# Patient Record
Sex: Male | Born: 2000 | Race: Black or African American | Hispanic: No | Marital: Single | Smoking: Never smoker
Health system: Southern US, Community
[De-identification: ages and names within clinical notes are randomized; demographics above are authoritative.]

## PROBLEM LIST (undated history)

## (undated) ENCOUNTER — Emergency Department (HOSPITAL_COMMUNITY): Payer: Medicaid Other

## (undated) DIAGNOSIS — J302 Other seasonal allergic rhinitis: Secondary | ICD-10-CM

## (undated) HISTORY — DX: Other seasonal allergic rhinitis: J30.2

---
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
## 2021-09-21 NOTE — Progress Notes (Signed)
    SUBJECTIVE:   CHIEF COMPLAINT / HPI:  Chief Complaint  Patient presents with   Sinus Problem    Patient reports that he has had ongoing symptoms of congestion, rhinorrhea, occasional cough for over 1 year.  He often feels drainage in his throat.  He was taking a nasal spray at some point which did help but he has not taken this for a long time.  He notices symptoms mostly in the fall and spring.  He reports he has not been sexually active recently but does want STI screening.  He does not want blood work today.   declines HIV and RPR.  PERTINENT  PMH / PSH:   Patient Care Team: Littie Deeds, MD as PCP - General (Family Medicine)   OBJECTIVE:   BP 104/77   Pulse 74   Ht 5\' 8"  (1.727 m)   Wt 128 lb 2 oz (58.1 kg)   SpO2 97%   BMI 19.48 kg/m   Physical Exam Constitutional:      General: He is not in acute distress. HENT:     Head: Normocephalic and atraumatic.     Comments: No frontal or maxillary sinus tenderness    Nose: Nose normal.     Mouth/Throat:     Mouth: Mucous membranes are moist.     Pharynx: Oropharynx is clear.  Cardiovascular:     Rate and Rhythm: Normal rate and regular rhythm.  Pulmonary:     Effort: Pulmonary effort is normal. No respiratory distress.     Breath sounds: Normal breath sounds.  Neurological:     Mental Status: He is alert.         09/24/2021    2:50 PM  Depression screen PHQ 2/9  Decreased Interest 1  Down, Depressed, Hopeless 1  PHQ - 2 Score 2  Altered sleeping 0  Tired, decreased energy 0  Change in appetite 0  Feeling bad or failure about yourself  1  Trouble concentrating 0  Moving slowly or fidgety/restless 0  Suicidal thoughts 0  PHQ-9 Score 3  Difficult doing work/chores Not difficult at all     {Show previous vital signs (optional):23777}    ASSESSMENT/PLAN:   Allergic rhinitis Seasonal allergic rhinitis in the fall and spring. - cetirizine - Flonase    Return in about 6 months (around 03/26/2022)  for physical.   Littie Deeds, MD [facility]

## 2021-09-24 ENCOUNTER — Encounter: Payer: Self-pay | Admitting: Family Medicine

## 2021-09-24 ENCOUNTER — Telehealth: Payer: Self-pay | Admitting: Family Medicine

## 2021-09-24 ENCOUNTER — Ambulatory Visit (INDEPENDENT_AMBULATORY_CARE_PROVIDER_SITE_OTHER): Payer: Medicaid Other | Admitting: Family Medicine

## 2021-09-24 ENCOUNTER — Other Ambulatory Visit (HOSPITAL_COMMUNITY)
Admission: RE | Admit: 2021-09-24 | Discharge: 2021-09-24 | Disposition: A | Discharge status: Home / Self Care | Payer: Medicaid Other | Source: Ambulatory Visit | Attending: Family Medicine | Admitting: Family Medicine

## 2021-09-24 VITALS — BP 104/77 | HR 74 | Ht 68.0 in | Wt 128.1 lb

## 2021-09-24 DIAGNOSIS — Z113 Encounter for screening for infections with a predominantly sexual mode of transmission: Secondary | ICD-10-CM

## 2021-09-24 DIAGNOSIS — J309 Allergic rhinitis, unspecified: Secondary | ICD-10-CM | POA: Insufficient documentation

## 2021-09-24 DIAGNOSIS — J302 Other seasonal allergic rhinitis: Secondary | ICD-10-CM

## 2021-09-24 MED ORDER — FLUTICASONE PROPIONATE 50 MCG/ACT NA SUSP: 1.0000 | Freq: Every day | NASAL | 12 refills | Status: DC

## 2021-09-24 MED ORDER — CETIRIZINE HCL 10 MG PO TABS: 10.0000 mg | ORAL_TABLET | Freq: Every day | ORAL | 11 refills | Status: DC

## 2021-09-24 NOTE — Telephone Encounter (Signed)
Medications sent to [facility]

## 2021-09-24 NOTE — Assessment & Plan Note (Signed)
Seasonal allergic rhinitis in the fall and spring. - cetirizine - Flonase

## 2021-09-25 ENCOUNTER — Encounter: Payer: Self-pay | Admitting: Family Medicine

## 2021-09-25 LAB — URINE CYTOLOGY ANCILLARY ONLY
Chlamydia: NEGATIVE
Comment: NEGATIVE
Comment: NORMAL
Neisseria Gonorrhea: NEGATIVE

## 2022-04-27 ENCOUNTER — Other Ambulatory Visit: Payer: Self-pay

## 2022-04-27 ENCOUNTER — Emergency Department (HOSPITAL_BASED_OUTPATIENT_CLINIC_OR_DEPARTMENT_OTHER)
Admission: EM | Admit: 2022-04-27 | Discharge: 2022-04-27 | Disposition: A | Discharge status: Home / Self Care | Payer: Medicaid Other | Attending: Emergency Medicine | Admitting: Emergency Medicine

## 2022-04-27 DIAGNOSIS — W228XXA Striking against or struck by other objects, initial encounter: Secondary | ICD-10-CM | POA: Insufficient documentation

## 2022-04-27 DIAGNOSIS — Z23 Encounter for immunization: Secondary | ICD-10-CM | POA: Insufficient documentation

## 2022-04-27 DIAGNOSIS — S01111A Laceration without foreign body of right eyelid and periocular area, initial encounter: Secondary | ICD-10-CM | POA: Insufficient documentation

## 2022-04-27 MED ORDER — TETANUS-DIPHTH-ACELL PERTUSSIS 5-2.5-18.5 LF-MCG/0.5 IM SUSY
0.5000 mL | PREFILLED_SYRINGE | Freq: Once | INTRAMUSCULAR | Status: AC
  Administered 2022-04-27: 0.5 mL via INTRAMUSCULAR
  Filled 2022-04-27: qty 0.5

## 2022-04-27 MED ORDER — LIDOCAINE-EPINEPHRINE-TETRACAINE (LET) TOPICAL GEL
3.0000 mL | Freq: Once | TOPICAL | Status: AC
  Administered 2022-04-27: 3 mL via TOPICAL
  Filled 2022-04-27: qty 3

## 2022-04-27 MED ORDER — LIDOCAINE HCL (PF) 1 % IJ SOLN
5.0000 mL | Freq: Once | INTRAMUSCULAR | Status: DC
  Filled 2022-04-27: qty 5

## 2022-04-27 NOTE — Discharge Instructions (Addendum)
You were seen in the ER today for evaluation of your eyebrow laceration. Please return to your PCP, UC, or the ER for removal of the sutures in 7 days. In the meantime, please make sure you are cleansing the area at least once daily with Dial soap and water. Do not soak your head or face in anything like bath tubs, pools, lakes, etc. You can take Tylenol or ibuprofen as needed for pain. If you have any concerns, new or worsening symptoms, please return to the ER for evaluation.   Contact a doctor if: You got a tetanus shot and you have any of these problems where the needle went in: Swelling. Very bad pain. Redness. Bleeding. A wound that was closed breaks open. You have a fever. You have any of these signs of infection in your wound: More redness, swelling, or pain. Fluid or blood. Warmth. Pus or a bad smell. You see something coming out of the wound, such as wood or glass. Medicine does not make your pain go away. You notice a change in the color of your skin near your wound. You need to change the bandage often. You have a new rash. You lose feeling (have numbness) around the wound. Get help right away if: You have very bad swelling around the wound. Your pain suddenly gets worse and is very bad. You have painful lumps near the wound or on skin anywhere on your body. You have a red streak going away from your wound. The wound is on your hand or foot, and: You cannot move a finger or toe. Your fingers or toes look pale or bluish.
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
## 2022-05-04 ENCOUNTER — Encounter (HOSPITAL_BASED_OUTPATIENT_CLINIC_OR_DEPARTMENT_OTHER): Payer: Self-pay | Admitting: *Deleted

## 2022-05-04 ENCOUNTER — Emergency Department (HOSPITAL_BASED_OUTPATIENT_CLINIC_OR_DEPARTMENT_OTHER)
Admission: EM | Admit: 2022-05-04 | Discharge: 2022-05-04 | Disposition: A | Discharge status: Home / Self Care | Payer: Medicaid Other | Attending: Emergency Medicine | Admitting: Emergency Medicine

## 2022-05-04 ENCOUNTER — Other Ambulatory Visit: Payer: Self-pay

## 2022-05-04 DIAGNOSIS — Z4802 Encounter for removal of sutures: Secondary | ICD-10-CM | POA: Insufficient documentation

## 2022-05-04 DIAGNOSIS — X58XXXD Exposure to other specified factors, subsequent encounter: Secondary | ICD-10-CM | POA: Insufficient documentation

## 2022-05-04 DIAGNOSIS — S01111D Laceration without foreign body of right eyelid and periocular area, subsequent encounter: Secondary | ICD-10-CM | POA: Diagnosis not present

## 2022-05-04 NOTE — ED Triage Notes (Signed)
BIB family for suture removal. #3 sutures noted to R medial eyebrow. Appears to be healing well, approximated well, no s/sx of infection. No redness, drainage, swelling, or pain.

## 2022-05-04 NOTE — ED Provider Notes (Signed)
[facility] Provider Note   CSN: [id number] Arrival date & time: 05/04/22  0350     History  Chief Complaint  Patient presents with   Suture / Staple Removal    Stanley Hays is a 22 y.o. male.  The history is provided by the patient and a parent. No language interpreter was used.  Suture / Staple Removal This is a new problem. The problem has been resolved. Pertinent negatives include no chest pain, no abdominal pain, no headaches and no shortness of breath.   Patient had stitches placed 1/27 to right eyebrow.  3 stitches were placed that time.  He is return today for stitches to be removed.  No fevers, chills, drainage from the wound.  Feeling at his baseline.  No complaints at this time.  Wound appears to be approximated appropriately    Home Medications Prior to Admission medications   Medication Sig Start Date End Date Taking? Authorizing Provider  cetirizine (ZYRTEC) 10 MG tablet Take 1 tablet (10 mg total) by mouth daily. 09/24/21   Zola Button, MD  doxycycline (VIBRAMYCIN) 100 MG capsule Take 1 capsule (100 mg total) by mouth 2 (two) times daily. 02/02/20   Davonna Belling, MD  fluticasone (FLONASE) 50 MCG/ACT nasal spray Place 1 spray into both nostrils daily. 1 spray in each nostril every day 09/24/21   Zola Button, MD      Allergies    Amoxicillin    Review of Systems   Review of Systems  Constitutional:  Negative for chills and fever.  Respiratory:  Negative for shortness of breath.   Cardiovascular:  Negative for chest pain.  Gastrointestinal:  Negative for abdominal pain and nausea.  Neurological:  Negative for headaches.    Physical Exam Updated Vital Signs BP (!) 140/91 (BP Location: Right Arm)   Temp 97.8 F (36.6 C) (Oral)   Resp 20   Wt 56.7 kg   SpO2 100%   BMI 19.01 kg/m  Physical Exam Vitals and nursing note reviewed. Exam conducted with a chaperone present.  Constitutional:      General: He  is not in acute distress.    Appearance: Normal appearance. He is not ill-appearing, toxic-appearing or diaphoretic.  HENT:     Head: Normocephalic and atraumatic.      Right Ear: External ear normal.     Left Ear: External ear normal.     Nose: Nose normal.  Eyes:     General: No scleral icterus.       Right eye: No discharge.        Left eye: No discharge.  Cardiovascular:     Rate and Rhythm: Normal rate and regular rhythm.     Pulses: Normal pulses.  Pulmonary:     Effort: Pulmonary effort is normal. No respiratory distress.  Abdominal:     General: Abdomen is flat. There is no distension.  Musculoskeletal:     Cervical back: No rigidity.  Skin:    General: Skin is warm and dry.     Coloration: Skin is not jaundiced.  Neurological:     Mental Status: He is alert and oriented to person, place, and time.     GCS: GCS eye subscore is 4. GCS verbal subscore is 5. GCS motor subscore is 6.     ED Results / Procedures / Treatments   Labs (all labs ordered are listed, but only abnormal results are displayed) Labs Reviewed - No data to display  EKG None  Radiology No results found.  Procedures Procedures    Medications Ordered in ED Medications - No data to display  ED Course/ Medical Decision Making/ A&P                             Medical Decision Making Patient here for suture removal, this was completed at bedside.  Stable for discharge  The patient improved significantly and was discharged in stable condition. Detailed discussions were had with the patient regarding current findings, and need for close f/u with PCP or on call doctor. The patient has been instructed to return immediately if the symptoms worsen in any way for re-evaluation. Patient verbalized understanding and is in agreement with current care plan. All questions answered prior to discharge.    Amount and/or Complexity of Data Reviewed Independent Historian: parent External Data Reviewed:      Details: Recent ED visit            Final Clinical Impression(s) / ED Diagnoses Final diagnoses:  Visit for suture removal    Rx / DC Orders ED Discharge Orders     None         Jeanell Sparrow, DO 05/04/22 0820

## 2022-05-04 NOTE — Discharge Instructions (Signed)
It was a pleasure caring for you today in the emergency department.  Please return to the emergency department for any worsening or worrisome symptoms.  

## 2022-05-04 NOTE — ED Notes (Signed)
Dc instructions reviewed with patient. Patient voiced understanding. Dc with belongings.  °
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
## 2023-01-29 ENCOUNTER — Ambulatory Visit: Payer: Medicaid Other | Admitting: Internal Medicine

## 2023-01-29 ENCOUNTER — Encounter: Payer: Self-pay | Admitting: Internal Medicine

## 2023-01-29 VITALS — BP 104/64 | HR 76 | Resp 16 | Ht 67.75 in | Wt 129.0 lb

## 2023-01-29 DIAGNOSIS — J302 Other seasonal allergic rhinitis: Secondary | ICD-10-CM | POA: Diagnosis not present

## 2023-01-29 DIAGNOSIS — Z7689 Persons encountering health services in other specified circumstances: Secondary | ICD-10-CM

## 2023-01-29 DIAGNOSIS — Z1159 Encounter for screening for other viral diseases: Secondary | ICD-10-CM

## 2023-01-29 DIAGNOSIS — Z23 Encounter for immunization: Secondary | ICD-10-CM

## 2023-01-29 DIAGNOSIS — Z5941 Food insecurity: Secondary | ICD-10-CM

## 2023-01-29 DIAGNOSIS — Z114 Encounter for screening for human immunodeficiency virus [HIV]: Secondary | ICD-10-CM

## 2023-01-29 MED ORDER — CETIRIZINE HCL 10 MG PO TABS: 10.0000 mg | ORAL_TABLET | Freq: Every day | ORAL | 11 refills | Status: AC

## 2023-01-29 NOTE — Patient Instructions (Signed)
Heating pad and stretches every night On nights with more pain, take Ibuprofen 200 mg tabs--take 3 with food and see if that helps.

## 2023-01-29 NOTE — Progress Notes (Signed)
    01/29/2023   12:54 PM  GAD 7 : Generalized Anxiety Score  Nervous, Anxious, on Edge 1  Control/stop worrying 0  Worry too much - different things 0  Trouble relaxing 0  Restless 1  Easily annoyed or irritable 0  Afraid - awful might happen 0  Total GAD 7 Score 2  Anxiety Difficulty Somewhat difficult        01/29/2023   12:55 PM 09/24/2021    2:50 PM 01/07/2019    2:49 PM 11/17/2014    1:45 PM  Depression screen PHQ 2/9  Decreased Interest 0 1 0 0  Down, Depressed, Hopeless 0 1 0 0  PHQ - 2 Score 0 2 0 0  Altered sleeping 1 0    Tired, decreased energy 0 0    Change in appetite 0 0    Feeling bad or failure about yourself  0 1    Trouble concentrating 0 0    Moving slowly or fidgety/restless 0 0    Suicidal thoughts 0 0    PHQ-9 Score 1 3    Difficult doing work/chores Not difficult at all Not difficult at all    Client was informed of all the resources that [facility] can provide. Client has no needs at this time. Client has agreed that we can check in with him in a bout a month. Dr. Delrae Alfred was informed.

## 2023-01-29 NOTE — Progress Notes (Signed)
Subjective:    Patient ID: Stanley Hays, male   DOB: 08/26/2000, 22 y.o.   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   Occurs perhaps 2-3 times weekly.  Lasts for 1-2 hours.  Does not take anything.  Does not keep him up at night.  Describes the discomfort as slight.    2.  History of fall seasonal allergies.  Uses mucinex, but has used Zyrtec and flonase in past.  Willing to consider zyrtec.    No outpatient medications have been marked as taking for the 01/29/23 encounter (Office Visit) with Julieanne Manson, MD.   Allergies  Allergen Reactions   Amoxicillin Anaphylaxis, Shortness Of Breath, Swelling and Other (See Comments)    Facial swelling and trouble breathing. At age 62   Past Medical History:  Diagnosis Date   Seasonal allergies    Nasal and throat symptoms with cough.   Past Surgical History:  Procedure Laterality Date   Repair of scalp laceration  2021   Left occipital   Family History  Problem Relation Age of Onset   Lung cancer Maternal Aunt    Breast cancer Paternal Aunt    Family Status  Relation Name Status   Mother  Alive, age 72y   Father  Alive, age 48y   Sister  Alive, age 18y   Sister  Alive, age 4y   Brother  Alive, age 72y   Brother  Alive, age 19y   Mat Aunt  (Not Specified)   Emelda Brothers  (Not Specified)  No partnership data on file   Social History   Socioeconomic History   Marital status: Single    Spouse name: Not on file   Number of children: Not on file   Years of education: Not on file   Highest education level: Some college, no degree  Occupational History   Occupation: Consulting civil engineer    Comment: 9th grade   Occupation: busboy and Transport planner and [employer]  Tobacco Use   Smoking status: Never    Passive exposure: Current   Smokeless tobacco: Never  Vaping Use   Vaping status: Never Used  Substance and  Sexual Activity   Alcohol use: Yes    Comment: Rare   Drug use: Yes    Frequency: 7.0 times per week    Types: Marijuana   Sexual activity: Not Currently  Other Topics Concern   Not on file  Social History Narrative   [employer] GED and working toward associates degree for PT.   No school since 2023.   Lives between two homes:  Paternal aunt and uncle in one home and father in another home.     Mother lives in [place].  Younger sister lives with his mom.   Older siblings are on their own.   Social Determinants of Health   Financial Resource Strain: Not on file  Food Insecurity: Food Insecurity Present (01/29/2023)   Hunger Vital Sign    Worried About Running Out of Food in the Last Year: Sometimes true    Ran Out of Food in the Last Year: Sometimes true  Transportation Needs: No Transportation Needs (01/29/2023)   PRAPARE - Administrator, Civil Service (Medical): No    Lack of Transportation (Non-Medical): No  Physical Activity: Not on file  Stress: Not on file  Social Connections: Not  on file  Intimate Partner Violence: Not At Risk (01/29/2023)   Humiliation, Afraid, Rape, and Kick questionnaire    Fear of Current or Ex-Partner: No    Emotionally Abused: No    Physically Abused: No    Sexually Abused: No     Review of Systems    Objective:   BP 104/64 (BP Location: Left Arm, Patient Position: Sitting, Cuff Size: Normal)   Pulse 76   Resp 16   Ht 5' 7.75" (1.721 m)   Wt 129 lb (58.5 kg)   BMI 19.76 kg/m   Physical Exam Constitutional:      Comments: Tall and thin.  Smells strongly of MJ  HENT:     Head: Normocephalic and atraumatic.     Right Ear: Tympanic membrane, ear canal and external ear normal.     Left Ear: Tympanic membrane, ear canal and external ear normal.     Nose: Congestion (boggy turbinates, particularly on left with clear discharge) present.     Mouth/Throat:     Mouth: Mucous membranes are moist.     Pharynx: No oropharyngeal  exudate.     Comments: Mildly cobbled posterior pharynx.   Eyes:     Extraocular Movements: Extraocular movements intact.     Conjunctiva/sclera: Conjunctivae normal.     Pupils: Pupils are equal, round, and reactive to light.  Neck:     Thyroid: No thyroid mass or thyromegaly.  Cardiovascular:     Rate and Rhythm: Normal rate and regular rhythm.     Heart sounds: S1 normal and S2 normal. No murmur heard.    No friction rub. No S3 or S4 sounds.     Comments: Radial and DP pulses normal and equal Pulmonary:     Effort: Pulmonary effort is normal.     Breath sounds: Normal breath sounds and air entry.  Abdominal:     General: Abdomen is flat. Bowel sounds are normal.     Palpations: Abdomen is soft. There is no hepatomegaly, splenomegaly or mass.     Tenderness: There is no abdominal tenderness.     Hernia: No hernia is present.  Musculoskeletal:     Right shoulder: No tenderness. Normal range of motion.     Left shoulder: No tenderness. Normal range of motion.     Cervical back: Normal range of motion and neck supple.       Back:     Right lower leg: No edema.     Left lower leg: No edema.     Comments: Nontender in area of complaint and no current palpable muscle spasm  Lymphadenopathy:     Head:     Right side of head: No submental or submandibular adenopathy.     Left side of head: No submental or submandibular adenopathy.     Cervical: No cervical adenopathy.     Upper Body:     Right upper body: No supraclavicular adenopathy.     Left upper body: No supraclavicular adenopathy.  Skin:    General: Skin is warm.     Capillary Refill: Capillary refill takes less than 2 seconds.     Findings: No rash.  Neurological:     General: No focal deficit present.     Mental Status: He is alert and oriented to person, place, and time.  Psychiatric:        Speech: Speech normal.        Behavior: Behavior normal. Behavior is cooperative.  Thought Content: Thought content  normal.      Assessment & Plan   Establish care:  CBC, CMP, FLP  2.  Left upper thoracic back pain:  went over warm packing and stretching every night.  Ibuprofen on evenings he has discomfort.  3.  Fall allergies:  Zyrtec 10 mg daily.  Call if no help.  Shoes left at front door.   4.  HM:  labs as above.  Influenza vaccine.  Refused COVID today.

## 2023-01-30 LAB — CBC WITH DIFFERENTIAL/PLATELET
Basophils Absolute: 0 10*3/uL (ref 0.0–0.2)
Basos: 0 %
EOS (ABSOLUTE): 0 10*3/uL (ref 0.0–0.4)
Eos: 0 %
Hematocrit: 40.6 % (ref 37.5–51.0)
Hemoglobin: 13.6 g/dL (ref 13.0–17.7)
Immature Grans (Abs): 0 10*3/uL (ref 0.0–0.1)
Immature Granulocytes: 0 %
Lymphocytes Absolute: 1.5 10*3/uL (ref 0.7–3.1)
Lymphs: 22 %
MCH: 32.5 pg (ref 26.6–33.0)
MCHC: 33.5 g/dL (ref 31.5–35.7)
MCV: 97 fL (ref 79–97)
Monocytes Absolute: 0.4 10*3/uL (ref 0.1–0.9)
Monocytes: 6 %
Neutrophils Absolute: 4.8 10*3/uL (ref 1.4–7.0)
Neutrophils: 72 %
Platelets: 269 10*3/uL (ref 150–450)
RBC: 4.18 x10E6/uL (ref 4.14–5.80)
RDW: 12.6 % (ref 11.6–15.4)
WBC: 6.7 10*3/uL (ref 3.4–10.8)

## 2023-01-30 LAB — COMPREHENSIVE METABOLIC PANEL
ALT: 9 [IU]/L (ref 0–44)
AST: 21 [IU]/L (ref 0–40)
Albumin: 4.3 g/dL (ref 4.3–5.2)
Alkaline Phosphatase: 88 [IU]/L (ref 44–121)
BUN/Creatinine Ratio: 19 (ref 9–20)
BUN: 14 mg/dL (ref 6–20)
Bilirubin Total: 0.2 mg/dL (ref 0.0–1.2)
CO2: 23 mmol/L (ref 20–29)
Calcium: 9.3 mg/dL (ref 8.7–10.2)
Chloride: 104 mmol/L (ref 96–106)
Creatinine, Ser: 0.75 mg/dL — ABNORMAL LOW (ref 0.76–1.27)
Globulin, Total: 2.4 g/dL (ref 1.5–4.5)
Glucose: 91 mg/dL (ref 70–99)
Potassium: 4.8 mmol/L (ref 3.5–5.2)
Sodium: 141 mmol/L (ref 134–144)
Total Protein: 6.7 g/dL (ref 6.0–8.5)
eGFR: 132 mL/min/{1.73_m2} (ref >59)

## 2023-01-30 LAB — LIPID PANEL W/O CHOL/HDL RATIO
Cholesterol, Total: 150 mg/dL (ref 100–199)
HDL: 70 mg/dL (ref >39)
LDL Chol Calc (NIH): 67 mg/dL (ref 0–99)
Triglycerides: 65 mg/dL (ref 0–149)
VLDL Cholesterol Cal: 13 mg/dL (ref 5–40)

## 2023-01-30 LAB — HEPATITIS C ANTIBODY: Hep C Virus Ab: NONREACTIVE

## 2023-01-30 LAB — HIV ANTIBODY (ROUTINE TESTING W REFLEX): HIV Screen 4th Generation wRfx: NONREACTIVE

## 2023-02-05 ENCOUNTER — Ambulatory Visit: Payer: Medicaid Other | Admitting: Psychology

## 2023-02-05 DIAGNOSIS — Z09 Encounter for follow-up examination after completed treatment for conditions other than malignant neoplasm: Secondary | ICD-10-CM

## 2023-02-21 NOTE — Progress Notes (Signed)
 Case Management 02/05/2023  Client expressed a need for case management to address food insecurity. Client was unable to make initial intake appointment and was contacted multiple time by phone to schedule new appointment. Client was also provided with a food resource listing by phone.     Gerre Scull, MSW Student

## 2023-09-01 ENCOUNTER — Emergency Department (HOSPITAL_BASED_OUTPATIENT_CLINIC_OR_DEPARTMENT_OTHER)
Admission: EM | Admit: 2023-09-01 | Discharge: 2023-09-01 | Disposition: A | Discharge status: Home / Self Care | Attending: Emergency Medicine | Admitting: Emergency Medicine

## 2023-09-01 ENCOUNTER — Encounter (HOSPITAL_BASED_OUTPATIENT_CLINIC_OR_DEPARTMENT_OTHER): Payer: Self-pay

## 2023-09-01 ENCOUNTER — Other Ambulatory Visit: Payer: Self-pay

## 2023-09-01 DIAGNOSIS — B353 Tinea pedis: Secondary | ICD-10-CM | POA: Diagnosis not present

## 2023-09-01 DIAGNOSIS — M79672 Pain in left foot: Secondary | ICD-10-CM | POA: Diagnosis present

## 2023-09-01 MED ORDER — TERBINAFINE HCL 1 % EX CREA: 1.0000 | TOPICAL_CREAM | Freq: Two times a day (BID) | CUTANEOUS | 0 refills | Status: AC

## 2023-09-01 NOTE — ED Triage Notes (Signed)
 Complaining of pain in the left pinky toe. Started 2 years ago from athletes foot that was not treated.

## 2023-09-01 NOTE — Discharge Instructions (Addendum)
 I have sent in a cream for athlete's foot.  Use this twice daily for 2 weeks.  Establish a primary care doctor.  Return for any emergent symptoms.

## 2023-09-01 NOTE — ED Provider Notes (Signed)
 [facility] AT [facility] Provider Note   CSN: [id number] Arrival date & time: 09/01/23  1302     History  Chief Complaint  Patient presents with   Foot Pain    Stanley Hays is a 23 y.o. male.  23 year old male presents today for concern of athlete's foot to his left foot.  He states that he has been dealing with this for the past 2 years.  Has not had this treated.  States he finally decided to come in to have this evaluated and treated.  No pain.  The history is provided by the patient. No language interpreter was used.       Home Medications Prior to Admission medications   Medication Sig Start Date End Date Taking? Authorizing Provider  terbinafine (LAMISIL) 1 % cream Apply 1 Application topically 2 (two) times daily. Apply twice daily for 2 weeks 09/01/23  Yes Ceclia Cohens, Kyden Potash, PA-C  cetirizine  (ZYRTEC ) 10 MG tablet Take 1 tablet (10 mg total) by mouth daily. 01/29/23   Ronalee Cocking, MD      Allergies    Amoxicillin    Review of Systems   Review of Systems  Constitutional:  Negative for fever.  Skin:  Negative for wound.  All other systems reviewed and are negative.   Physical Exam Updated Vital Signs BP (!) 140/74 (BP Location: Right Arm)   Pulse (!) 109   Temp 98.3 F (36.8 C) (Oral)   Resp 18   Ht 5\' 7"  (1.702 m)   Wt 61.2 kg   SpO2 100%   BMI 21.14 kg/m  Physical Exam Vitals and nursing note reviewed.  Constitutional:      General: He is not in acute distress.    Appearance: Normal appearance. He is not ill-appearing.  HENT:     Head: Normocephalic and atraumatic.     Nose: Nose normal.  Eyes:     Conjunctiva/sclera: Conjunctivae normal.  Cardiovascular:     Rate and Rhythm: Normal rate.     Comments: Initially noted to be tachycardic but normal rate on my evaluation. Pulmonary:     Effort: Pulmonary effort is normal. No respiratory distress.  Musculoskeletal:        General: No deformity.  Skin:     Findings: No rash.     Comments: Area between the 4th and 5th digit on the left foot has a white area.  No drainage, no tenderness, no surrounding erythema.  Neurovascularly intact in left lower extremity.  Neurological:     Mental Status: He is alert.     ED Results / Procedures / Treatments   Labs (all labs ordered are listed, but only abnormal results are displayed) Labs Reviewed - No data to display  EKG None  Radiology No results found.  Procedures Procedures    Medications Ordered in ED Medications - No data to display  ED Course/ Medical Decision Making/ A&P                                 Medical Decision Making Risk OTC drugs.   23 year old male presents today for concern of tinea pedis to the left foot.  Exam consistent with this.  Terbinafine sent into the pharmacy.  PCP information given to establish care.  Discharged in stable condition.   Final Clinical Impression(s) / ED Diagnoses Final diagnoses:  Tinea pedis of left foot    Rx / DC  Orders ED Discharge Orders          Ordered    terbinafine (LAMISIL) 1 % cream  2 times daily        09/01/23 1513              Lucina Sabal, [place] 09/01/23 1516    Lind Repine, MD 09/01/23 1525

## 2024-02-02 ENCOUNTER — Encounter: Payer: Medicaid Other | Admitting: Internal Medicine
